# Patient Record
Sex: Male | Born: 2000 | Hispanic: No | Marital: Single | Smoking: Never smoker
Health system: Southern US, Community
[De-identification: ages and names within clinical notes are randomized; demographics above are authoritative.]

## PROBLEM LIST (undated history)

## (undated) DIAGNOSIS — T7840XA Allergy, unspecified, initial encounter: Secondary | ICD-10-CM

## (undated) DIAGNOSIS — H5213 Myopia, bilateral: Secondary | ICD-10-CM

## (undated) HISTORY — DX: Allergy, unspecified, initial encounter: T78.40XA

## (undated) HISTORY — PX: OTHER SURGICAL HISTORY: SHX169

## (undated) HISTORY — DX: Myopia, bilateral: H52.13

---
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
## 2019-02-10 ENCOUNTER — Telehealth: Payer: Self-pay | Admitting: Pediatrics

## 2019-02-10 NOTE — Telephone Encounter (Signed)

## 2019-02-11 ENCOUNTER — Ambulatory Visit: Payer: Medicaid Other | Admitting: Pediatrics

## 2019-02-24 DIAGNOSIS — H5213 Myopia, bilateral: Secondary | ICD-10-CM | POA: Diagnosis not present

## 2019-03-15 ENCOUNTER — Ambulatory Visit: Payer: Medicaid Other | Admitting: Pediatrics

## 2019-08-19 DIAGNOSIS — Z7189 Other specified counseling: Secondary | ICD-10-CM | POA: Diagnosis not present

## 2019-08-19 DIAGNOSIS — Z20828 Contact with and (suspected) exposure to other viral communicable diseases: Secondary | ICD-10-CM | POA: Diagnosis not present

## 2020-03-14 DIAGNOSIS — Z03818 Encounter for observation for suspected exposure to other biological agents ruled out: Secondary | ICD-10-CM | POA: Diagnosis not present

## 2020-04-13 DIAGNOSIS — Z23 Encounter for immunization: Secondary | ICD-10-CM | POA: Diagnosis not present

## 2020-05-11 DIAGNOSIS — Z23 Encounter for immunization: Secondary | ICD-10-CM | POA: Diagnosis not present

## 2020-06-21 DIAGNOSIS — H5213 Myopia, bilateral: Secondary | ICD-10-CM | POA: Diagnosis not present

## 2021-03-28 ENCOUNTER — Telehealth: Payer: Self-pay

## 2021-03-28 DIAGNOSIS — Z09 Encounter for follow-up examination after completed treatment for conditions other than malignant neoplasm: Secondary | ICD-10-CM

## 2021-03-28 NOTE — Telephone Encounter (Signed)
SWCM mailed adolescent transition dismissal letter to pt, marked dismissed  from practice in pt's chart due to age. Can be seen by [facility] until age 20 if needed.   Grey Schlauch, BSW, QP Case Manager [facility] Office: [phone] Direct Number: [phone]  

## 2023-03-12 DIAGNOSIS — H5213 Myopia, bilateral: Secondary | ICD-10-CM | POA: Diagnosis not present

## 2024-02-16 DIAGNOSIS — N341 Nonspecific urethritis: Secondary | ICD-10-CM | POA: Diagnosis not present

## 2024-02-16 DIAGNOSIS — Z114 Encounter for screening for human immunodeficiency virus [HIV]: Secondary | ICD-10-CM | POA: Diagnosis not present

## 2024-02-16 DIAGNOSIS — Z113 Encounter for screening for infections with a predominantly sexual mode of transmission: Secondary | ICD-10-CM | POA: Diagnosis not present
